# Patient Record
Sex: Female | Born: 1995 | Race: White | Hispanic: No | Marital: Single | Smoking: Never smoker
Health system: Southern US, Community
[De-identification: ages and names within clinical notes are randomized; demographics above are authoritative.]

## PROBLEM LIST (undated history)

## (undated) HISTORY — PX: ADENOIDECTOMY: SUR15

## (undated) HISTORY — PX: TONSILLECTOMY: SUR1361

---
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
## 2015-04-02 IMAGING — CT CT ABD-PELV W/O CM
3 of 4 series · 8 of 46 positions shown, 15 images · non-contrast
Comparison: None.

CLINICAL DATA: Left flank pain, hematuria

EXAM:
CT ABDOMEN AND PELVIS WITHOUT CONTRAST
TECHNIQUE: Multidetector CT imaging of the abdomen and pelvis was performed
following the standard protocol without intravenous contrast.

[Series 3: lung 5.0 b60f · axial · 0.67mm/px · z∈[-82,-22]mm · 4 of 20 slices shown, 9 images]
[im 4/20  soft-tissue]
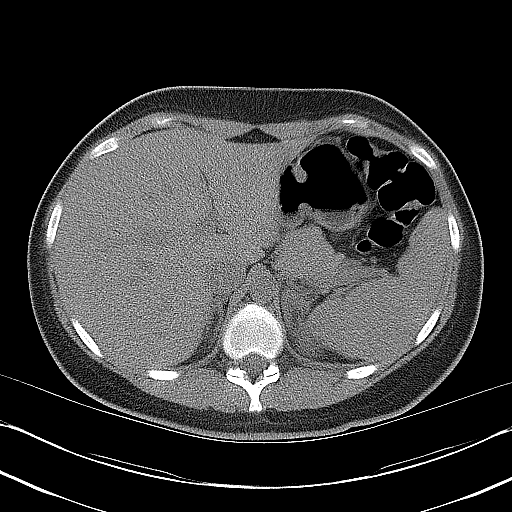
[im 4/20  lung]
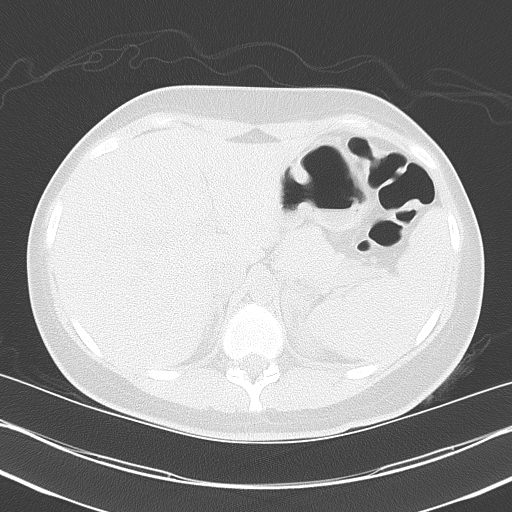
[im 4/20  bone]
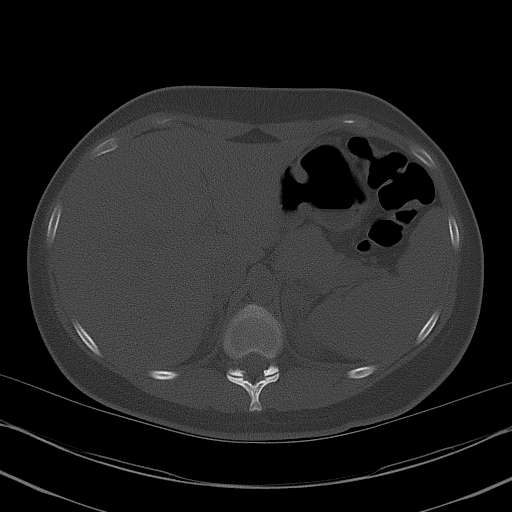
[im 8/20  soft-tissue]
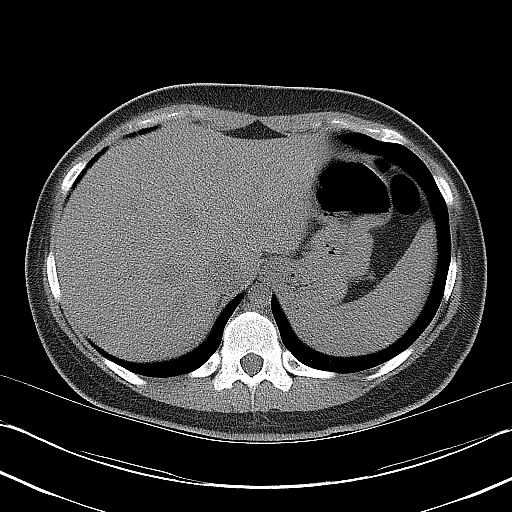
[im 8/20  lung]
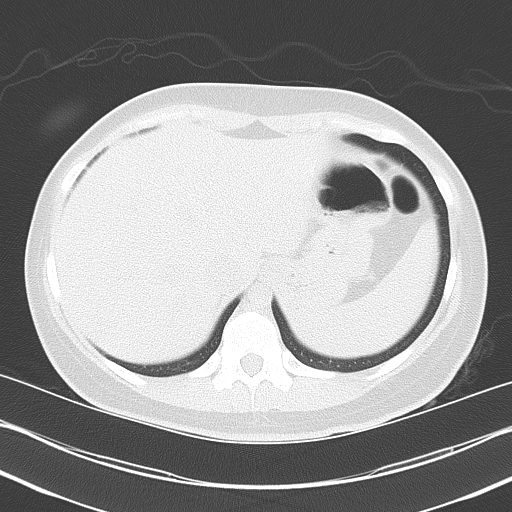
[im 12/20  soft-tissue]
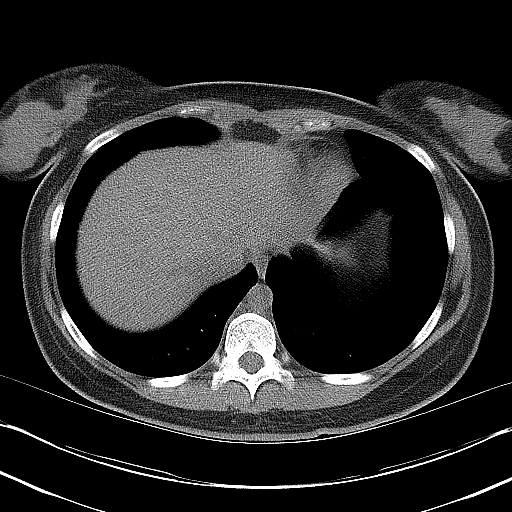
[im 12/20  lung]
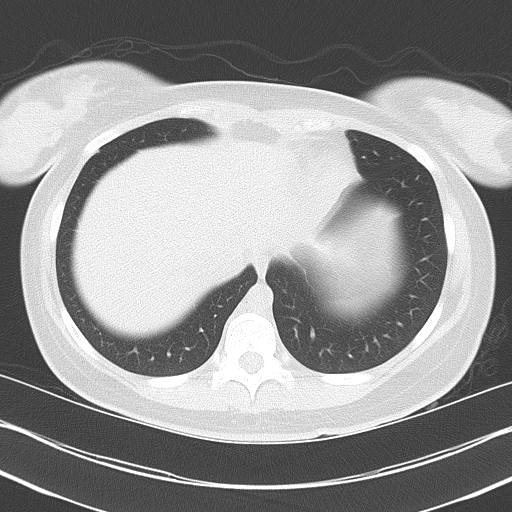
[im 16/20  soft-tissue]
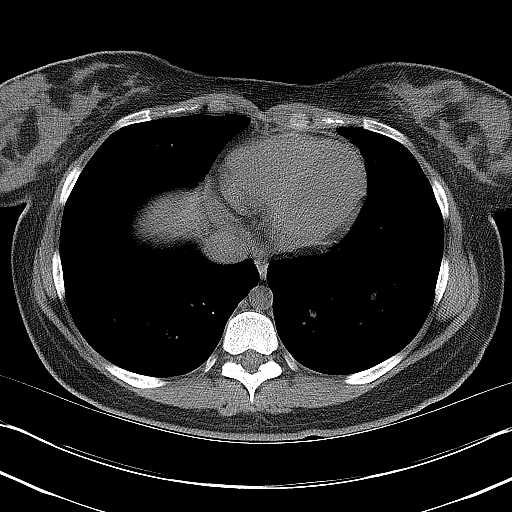
[im 16/20  lung]
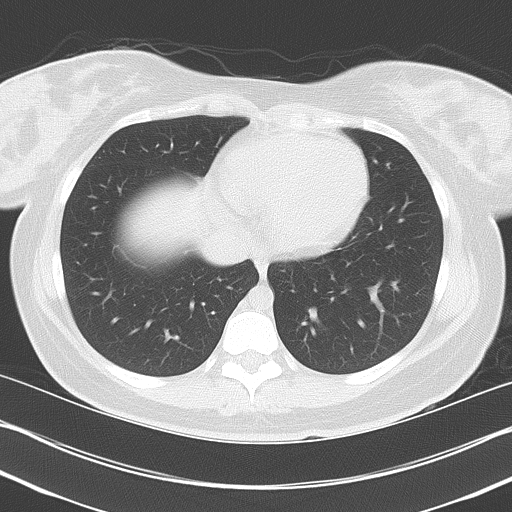

[Series 4: mpr coronal (id) · coronal · 0.69mm/px · 3 of 73 slices shown, 4 images]
[im 25/73  soft-tissue]
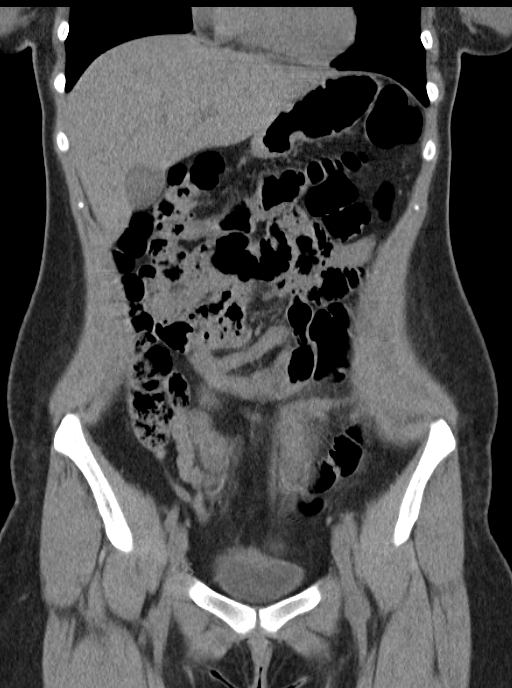
[im 33/73  soft-tissue]
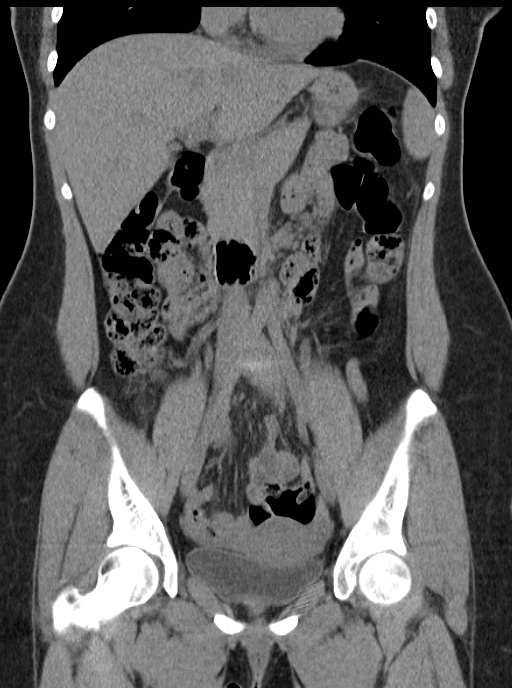
[im 33/73  bone]
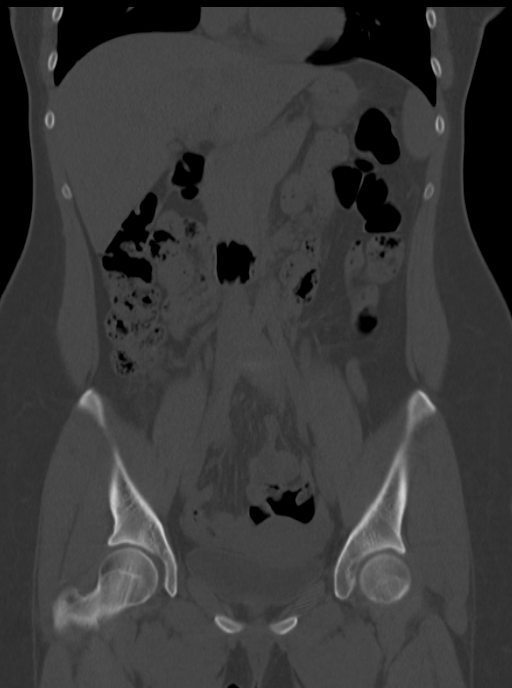
[im 41/73  soft-tissue]
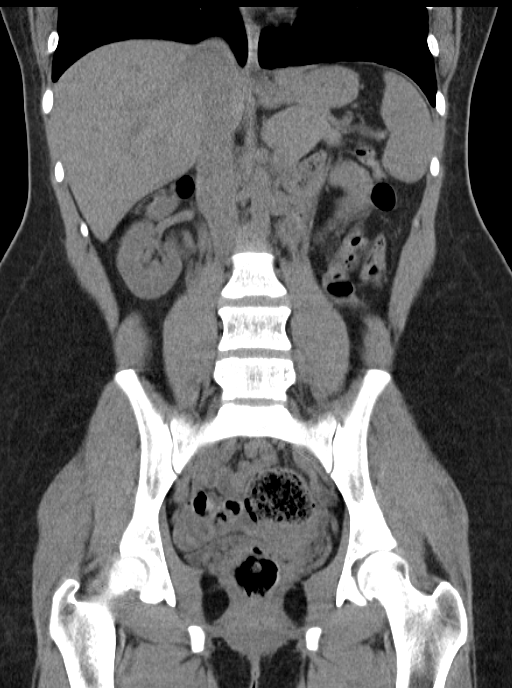

[Series 5: mpr sagittal (id) · sagittal · 0.46mm/px · 1 of 113 slices shown, 2 images]
[im 38/113  soft-tissue]
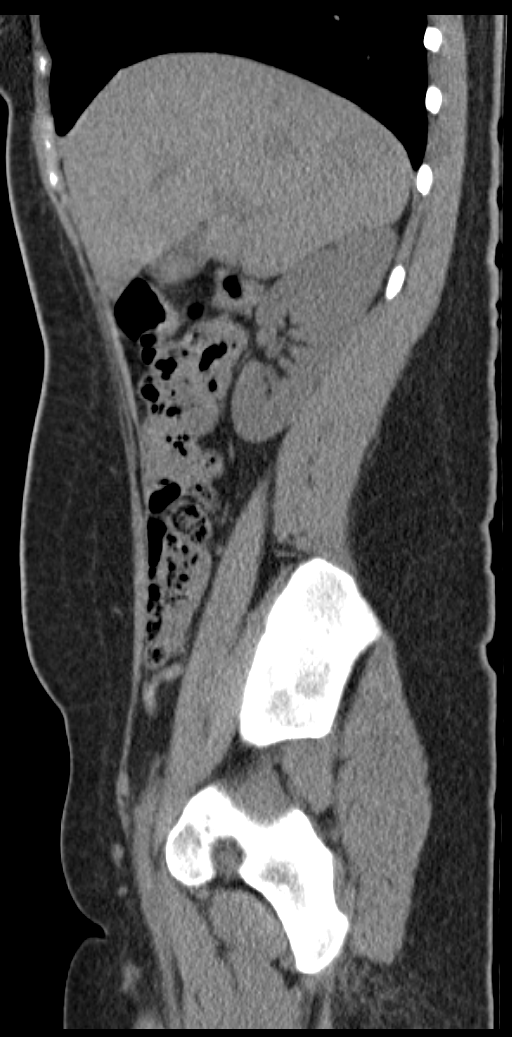
[im 38/113  bone]
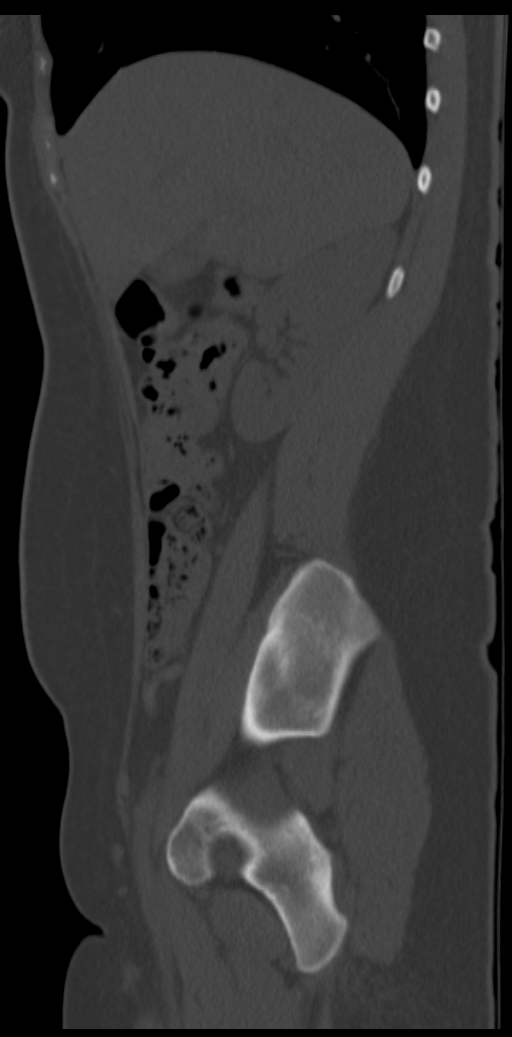

[8 of 46 positions shown; findings below may reference images not displayed]

FINDINGS: Lung bases are unremarkable. Sagittal images of the spine are
unremarkable. Unenhanced liver, spleen, pancreas and adrenal glands
are unremarkable.

No aortic aneurysm. No calcified gallstones are noted within
gallbladder.

There is no nephrolithiasis. Minimal dilatation of the left renal
collecting system. No left hydroureter. No proximal or mid ureteral
calculi are noted bilaterally.

In axial image 77 there is punctate 2 mm nonobstructive calcified
calculus in left UVJ. Best seen in coronal image 40.

Abundant stool noted within rectum. Rectum measures 6.6 cm in
diameter suspicious for mild fecal impaction.

No pericecal inflammation.  Normal appendix.

No small bowel obstruction.  No ascites or free air.  No adenopathy.

Unenhanced uterus and adnexa are unremarkable.
IMPRESSION: 1. Mild dilatation of left renal collecting system without frank
hydronephrosis. No hydroureter. There is 2 mm punctate
nonobstructive calcified calculus in the left UVJ
2. Normal appendix.  No pericecal inflammation.
3. There is distension of the rectum with stool. The rectum measures
6.6 cm in diameter suspicious for fecal impaction.
